# Patient Record
Sex: Male | Born: 1945 | Race: White | Hispanic: No | Marital: Married | Smoking: Never smoker
Health system: Southern US, Community
[De-identification: ages and names within clinical notes are randomized; demographics above are authoritative.]

## PROBLEM LIST (undated history)

## (undated) DIAGNOSIS — N390 Urinary tract infection, site not specified: Secondary | ICD-10-CM

## (undated) DIAGNOSIS — I10 Essential (primary) hypertension: Secondary | ICD-10-CM

## (undated) DIAGNOSIS — E119 Type 2 diabetes mellitus without complications: Secondary | ICD-10-CM

## (undated) HISTORY — DX: Type 2 diabetes mellitus without complications: E11.9

## (undated) HISTORY — PX: TOE AMPUTATION: SHX809

## (undated) HISTORY — DX: Essential (primary) hypertension: I10

## (undated) HISTORY — DX: Urinary tract infection, site not specified: N39.0

---

## 2019-08-25 ENCOUNTER — Emergency Department (HOSPITAL_BASED_OUTPATIENT_CLINIC_OR_DEPARTMENT_OTHER): Payer: Medicare Other

## 2019-08-25 ENCOUNTER — Encounter (HOSPITAL_BASED_OUTPATIENT_CLINIC_OR_DEPARTMENT_OTHER): Payer: Self-pay | Admitting: Emergency Medicine

## 2019-08-25 ENCOUNTER — Other Ambulatory Visit: Payer: Self-pay

## 2019-08-25 ENCOUNTER — Emergency Department (HOSPITAL_BASED_OUTPATIENT_CLINIC_OR_DEPARTMENT_OTHER)
Admission: EM | Admit: 2019-08-25 | Discharge: 2019-08-26 | Disposition: A | Discharge status: Home / Self Care | Payer: Medicare Other | Attending: Emergency Medicine | Admitting: Emergency Medicine

## 2019-08-25 DIAGNOSIS — Z7984 Long term (current) use of oral hypoglycemic drugs: Secondary | ICD-10-CM | POA: Diagnosis not present

## 2019-08-25 DIAGNOSIS — I10 Essential (primary) hypertension: Secondary | ICD-10-CM | POA: Insufficient documentation

## 2019-08-25 DIAGNOSIS — K8020 Calculus of gallbladder without cholecystitis without obstruction: Secondary | ICD-10-CM | POA: Diagnosis not present

## 2019-08-25 DIAGNOSIS — M545 Low back pain, unspecified: Secondary | ICD-10-CM

## 2019-08-25 DIAGNOSIS — E119 Type 2 diabetes mellitus without complications: Secondary | ICD-10-CM | POA: Diagnosis not present

## 2019-08-25 DIAGNOSIS — Z79899 Other long term (current) drug therapy: Secondary | ICD-10-CM | POA: Diagnosis not present

## 2019-08-25 MED ORDER — KETOROLAC TROMETHAMINE 60 MG/2ML IM SOLN
15.0000 mg | Freq: Once | INTRAMUSCULAR | Status: AC
  Administered 2019-08-25: 15 mg via INTRAMUSCULAR
  Filled 2019-08-25: qty 2

## 2019-08-25 MED ORDER — MORPHINE SULFATE 15 MG PO TABS: 7.5000 mg | ORAL_TABLET | ORAL | 0 refills | Status: DC | PRN

## 2019-08-25 MED ORDER — OXYCODONE HCL 5 MG PO TABS
5.0000 mg | ORAL_TABLET | Freq: Once | ORAL | Status: AC
  Administered 2019-08-25: 5 mg via ORAL
  Filled 2019-08-25: qty 1

## 2019-08-25 MED ORDER — MORPHINE SULFATE 15 MG PO TABS: 7.5000 mg | ORAL_TABLET | ORAL | 0 refills | Status: AC | PRN

## 2019-08-25 MED ORDER — DIAZEPAM 2 MG PO TABS
2.0000 mg | ORAL_TABLET | Freq: Once | ORAL | Status: AC
  Administered 2019-08-25: 2 mg via ORAL
  Filled 2019-08-25: qty 1

## 2019-08-25 MED ORDER — ACETAMINOPHEN 500 MG PO TABS
1000.0000 mg | ORAL_TABLET | Freq: Once | ORAL | Status: AC
  Administered 2019-08-25: 1000 mg via ORAL
  Filled 2019-08-25: qty 2

## 2019-08-25 NOTE — ED Triage Notes (Signed)
Patient presents via EMS with complaints of lower back pain onset this am; states he may have aggravated it while playing with his granddaughter; states also being treated for a UTI and having to urinate frequently; states today while in the recliner he fell from a standing position. States unable to stand or ambulate due to pain currently. Moving all extremities wnl.

## 2019-08-25 NOTE — ED Provider Notes (Signed)
[facility] EMERGENCY DEPARTMENT Provider Note   CSN: [id number] Arrival date & time: 08/25/19  2010     History Chief Complaint  Patient presents with  . Back Pain    Wyatt Garcia is a 74 y.o. male.  74 yo M with a chief complaints of low back pain.  This is been off and on for him but worse over the past 24 hours.  He ended up having a spasm that made it so he could not get up and walk and he ended up laying on the ground for a few hours.  Eventually he was able to make his way to the phone and called for help.  Denies any injury to his back.  Denies radiation of the pain.  Has been having some urinary symptoms and is currently on ciprofloxacin and feels that they are improving.  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  He denies any leg pain or weakness denies loss of bowel or bladder denies loss of perirectal sensation.  The history is provided by the patient.  Back Pain Location:  Lumbar spine Quality:  Aching, burning and cramping Radiates to:  Does not radiate Pain severity:  Severe Onset quality:  Gradual Duration:  1 day Timing:  Constant Progression:  Worsening Chronicity:  New Relieved by:  Being still Worsened by:  Palpation, touching, twisting and bending Ineffective treatments:  None tried Associated symptoms: no abdominal pain, no chest pain, no fever and no headaches        Past Medical History:  Diagnosis Date  . Diabetes mellitus without complication (HCC)   . Hypertension   . UTI (urinary tract infection)     There are no problems to display for this patient.   Past Surgical History:  Procedure Laterality Date  . TOE AMPUTATION         History reviewed. No pertinent family history.  Social History   Tobacco Use  . Smoking status: Never Smoker  . Smokeless tobacco: Never Used  Substance Use Topics  . Alcohol use: Not Currently  . Drug use: Not Currently    Home  Medications Prior to Admission medications   Medication Sig Start Date End Date Taking? Authorizing Provider  ENALAPRIL MALEATE PO Take by mouth.   Yes [provider]  Ibrutinib (IMBRUVICA PO) Take by mouth.   Yes [provider]  METFORMIN HCL PO Take by mouth.   Yes [provider]  morphine (MSIR) 15 MG tablet Take 0.5 tablets (7.5 mg total) by mouth every 4 (four) hours as needed for severe pain. 08/25/19   Melene Plan, DO    Allergies    Patient has no known allergies.  Review of Systems   Review of Systems  Constitutional: Negative for chills and fever.  HENT: Negative for congestion and facial swelling.   Eyes: Negative for discharge and visual disturbance.  Respiratory: Negative for shortness of breath.   Cardiovascular: Negative for chest pain and palpitations.  Gastrointestinal: Negative for abdominal pain, diarrhea and vomiting.  Musculoskeletal: Positive for back pain. Negative for arthralgias and myalgias.  Skin: Negative for color change and rash.  Neurological: Negative for tremors, syncope and headaches.  Psychiatric/Behavioral: Negative for confusion and dysphoric mood.    Physical Exam Updated Vital Signs BP 136/81 (BP Location: Right Arm)   Pulse 98   Temp 99.1 F (37.3 C) (Oral)   Resp 20   Ht 6\' 3"  (  1.905 m)   Wt 112 kg   SpO2 98%   BMI 30.87 kg/m   Physical Exam Vitals and nursing note reviewed.  Constitutional:      Appearance: He is well-developed.  HENT:     Head: Normocephalic and atraumatic.  Eyes:     Pupils: Pupils are equal, round, and reactive to light.  Neck:     Vascular: No JVD.  Cardiovascular:     Rate and Rhythm: Normal rate and regular rhythm.     Heart sounds: No murmur heard.  No friction rub. No gallop.   Pulmonary:     Effort: No respiratory distress.     Breath sounds: No wheezing.  Abdominal:     General: There is no distension.     Tenderness: There is no guarding or rebound.    Musculoskeletal:        General: Tenderness present. Normal range of motion.     Cervical back: Normal range of motion and neck supple.     Comments: Mild tenderness diffusely about the low back.  And some spasm.  Wrapped right lower extremity with prior amputation of the great toe.  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  Skin:    Coloration: Skin is not pale.     Findings: No rash.  Neurological:     Mental Status: He is alert and oriented to person, place, and time.  Psychiatric:        Behavior: Behavior normal.     ED Results / Procedures / Treatments   Labs (all labs ordered are listed, but only abnormal results are displayed) Labs Reviewed - No data to display  EKG None  Radiology CT L-SPINE NO CHARGE  Result Date: 08/25/2019 CLINICAL DATA:  Lower back pain. EXAM: CT LUMBAR SPINE WITHOUT CONTRAST TECHNIQUE: Multidetector CT imaging of the lumbar spine was performed without intravenous contrast administration. Multiplanar CT image reconstructions were also generated. COMPARISON:  None. FINDINGS: Segmentation: 5 lumbar type vertebrae. Alignment: Normal. Vertebrae: No acute fracture or focal pathologic process. Paraspinal and other soft tissues: Negative. Disc levels: Moderate to marked severity endplate sclerosis is seen at the level of L4-L5, with mild endplate sclerosis seen throughout the remainder of the lumbar spine. Marked severity intervertebral disc space narrowing is seen at the level of L4-L5. Mild intervertebral disc space narrowing is seen throughout the remainder of the lumbar spine. There is mild to moderate severity bilateral multilevel facet joint hypertrophy. IMPRESSION: 1. No acute osseous abnormality. 2. 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 Electronically Signed   By: Aram Candelahaddeus  Houston M.D.   On: 08/25/2019 21:22   CT Renal Stone Study  Result Date: 08/25/2019 CLINICAL DATA:  Lower back pain. EXAM: CT ABDOMEN AND  PELVIS WITHOUT CONTRAST TECHNIQUE: Multidetector CT imaging of the abdomen and pelvis was performed following the standard protocol without IV contrast. COMPARISON:  None. FINDINGS: Lower chest: Very mild atelectasis is seen within the posterior aspect of the bilateral lung bases. A small left pleural effusion is noted. Hepatobiliary: No focal liver abnormality is seen. A subcentimeter gallstone is seen within the gallbladder lumen without evidence of gallbladder wall thickening or biliary dilatation. Pancreas: Unremarkable. No pancreatic ductal dilatation or surrounding inflammatory changes. Spleen: Normal in size without focal abnormality. Adrenals/Urinary Tract: Adrenal glands are unremarkable. Kidneys are normal in size without focal lesions or hydronephrosis. A 9 mm nonobstructing renal stone is seen within the anterior aspect of the mid left kidney. Bladder is unremarkable.  Stomach/Bowel: There is a small to moderate size hiatal hernia with marked severity dilatation of the distal esophagus. Appendix appears normal. No evidence of bowel wall thickening, distention, or inflammatory changes. Vascular/Lymphatic: There is moderate severity calcification of the abdominal aorta. No enlarged abdominal or pelvic lymph nodes. Reproductive: The prostate gland is moderate to markedly enlarged. Other: There is a 2.8 cm x 1.4 cm fat containing right inguinal hernia. Musculoskeletal: Mild to moderate severity degenerative changes seen within the lumbar spine. This is most prominent at the level of L4-L5. IMPRESSION: 1. Small to moderate size hiatal hernia with marked severity dilatation of the distal esophagus. 2. Cholelithiasis without evidence of acute cholecystitis. 3. 9 mm nonobstructing renal stone within the left kidney. 4. Fat containing right inguinal hernia. 5. Moderate to markedly enlarged prostate gland. 6. Aortic atherosclerosis. Aortic Atherosclerosis (ICD10-I70.0). Electronically Signed   By: Aram Candela  M.D.   On: 08/25/2019 21:13    Procedures Procedures (including critical care time)  Medications Ordered in ED Medications  acetaminophen (TYLENOL) tablet 1,000 mg (1,000 mg Oral Given 08/25/19 2054)  ketorolac (TORADOL) injection 15 mg (15 mg Intramuscular Given 08/25/19 2056)  oxyCODONE (Oxy IR/ROXICODONE) immediate release tablet 5 mg (5 mg Oral Given 08/25/19 2054)  diazepam (VALIUM) tablet 2 mg (2 mg Oral Given 08/25/19 2054)    ED Course  I have reviewed the triage vital signs and the nursing notes.  Pertinent labs & imaging results that were available during my care of the patient were reviewed by me and considered in my medical decision making (see chart for details).    MDM Rules/Calculators/A&P                          74 yo M with a chief complaints of low back pain.  This is a recurrent issue for him but much worse recently than it has been.  No pain to the abdomen no fevers no loss of bowel or bladder no leg weakness or numbness.  Recently treated for urinary tract infection with ciprofloxacin.  Feels it is improving.  Otherwise denies urinary symptoms.  Will obtain a CT scan.  Treat his pain here.  Reassess.  CT scan without acute pathology in the abdomen or pelvis.  Patient does have some degenerative disease worse at L4-L5.  Discussed results with the patient.  He is feeling much better.  We will have him follow-up with his family doctor in the office.  10:43 PM:  I have discussed the diagnosis/risks/treatment options with the patient and family and believe the pt to be eligible for discharge home to follow-up with PCP. We also discussed returning to the ED immediately if new or worsening sx occur. We discussed the sx which are most concerning (e.g., sudden worsening pain, fever, inability to tolerate by mouth, cauda equina s/sx) that necessitate immediate return. Medications administered to the patient during their visit and any new prescriptions provided to the patient are  listed below.  Medications given during this visit Medications  acetaminophen (TYLENOL) tablet 1,000 mg (1,000 mg Oral Given 08/25/19 2054)  ketorolac (TORADOL) injection 15 mg (15 mg Intramuscular Given 08/25/19 2056)  oxyCODONE (Oxy IR/ROXICODONE) immediate release tablet 5 mg (5 mg Oral Given 08/25/19 2054)  diazepam (VALIUM) tablet 2 mg (2 mg Oral Given 08/25/19 2054)     The patient appears reasonably screen and/or stabilized for discharge and I doubt any other medical condition or other [place] requiring further screening, evaluation, or  treatment in the ED at this time prior to discharge.   Final Clinical Impression(s) / ED Diagnoses Final diagnoses:  Low back pain    Rx / DC Orders ED Discharge Orders         Ordered    morphine (MSIR) 15 MG tablet  Every 4 hours PRN,   Status:  Discontinued     Reprint     08/25/19 2211    morphine (MSIR) 15 MG tablet  Every 4 hours PRN     Discontinue  Reprint     08/25/19 2212           Melene Plan, DO 08/25/19 2243

## 2019-08-25 NOTE — Discharge Instructions (Signed)

## 2021-12-30 IMAGING — CT CT RENAL STONE PROTOCOL
3 of 7 series · 16 of 46 positions shown, 18 images · non-contrast
Comparison: None.

CLINICAL DATA: Lower back pain.

EXAM:
CT ABDOMEN AND PELVIS WITHOUT CONTRAST
TECHNIQUE: Multidetector CT imaging of the abdomen and pelvis was performed
following the standard protocol without IV contrast.

[Series 2: axial st · axial · 0.80mm/px · z∈[+614,+1034]mm · 11 of 102 slices shown, 13 images]
[im 9/102  soft-tissue]
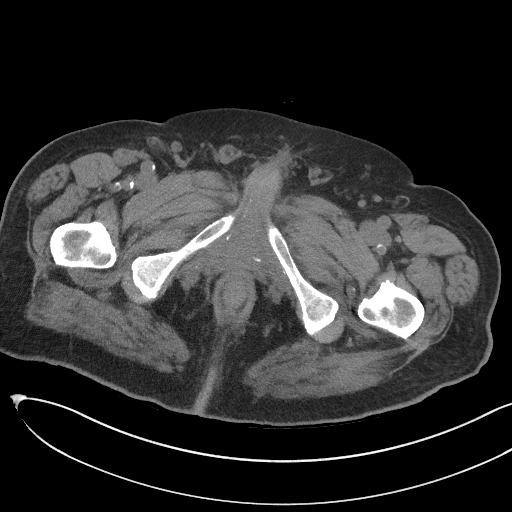
[im 9/102  bone]
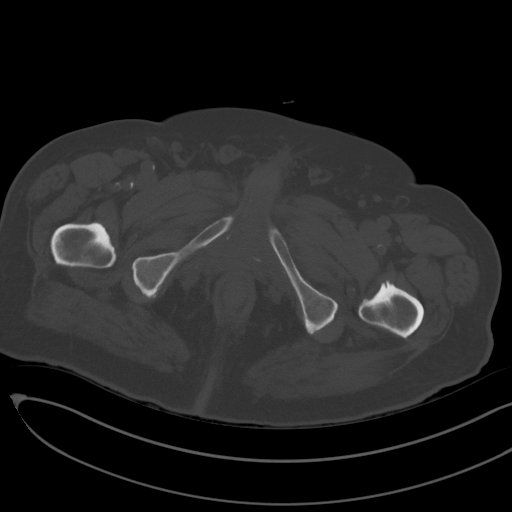
[im 17/102  soft-tissue]
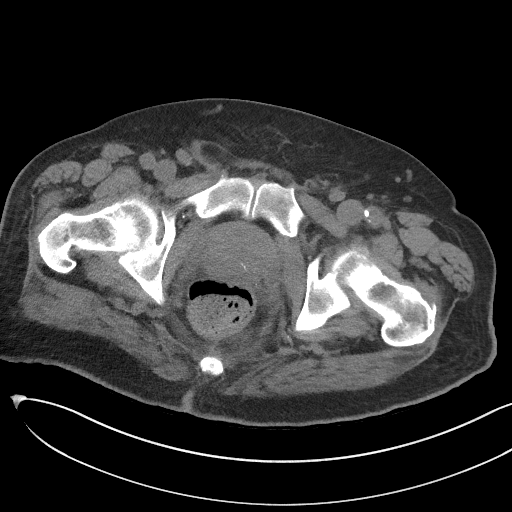
[im 26/102  soft-tissue]
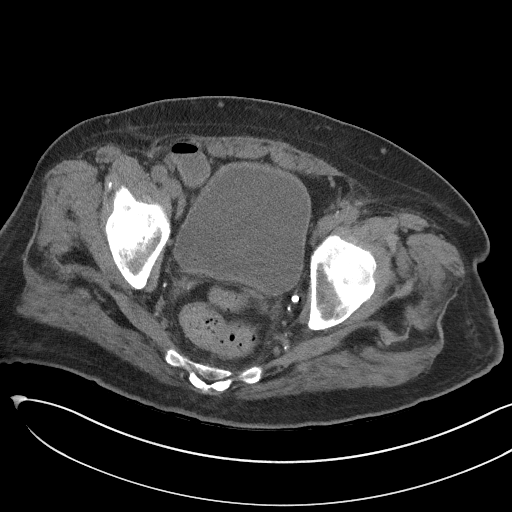
[im 34/102  soft-tissue]
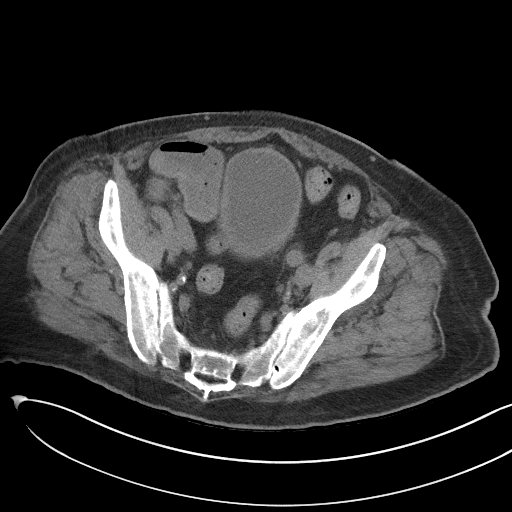
[im 43/102  soft-tissue]
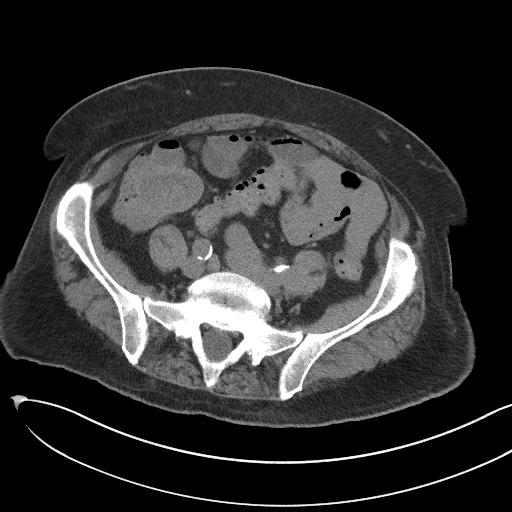
[im 51/102  soft-tissue]
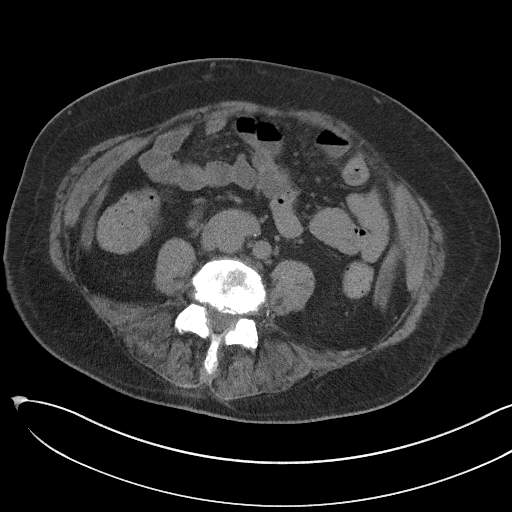
[im 59/102  soft-tissue]
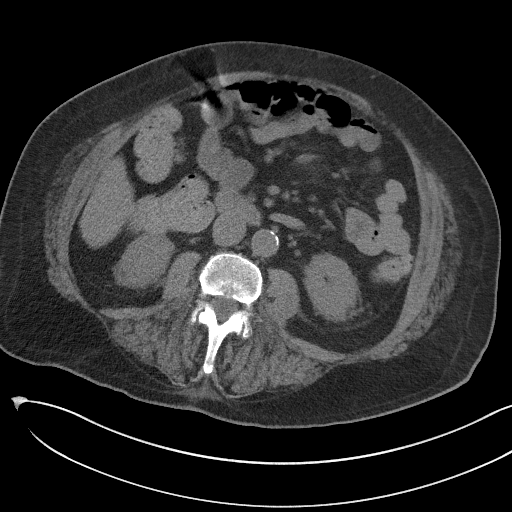
[im 68/102  soft-tissue]
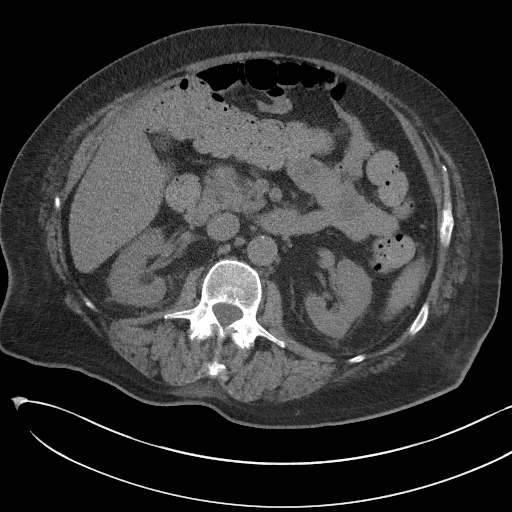
[im 76/102  soft-tissue]
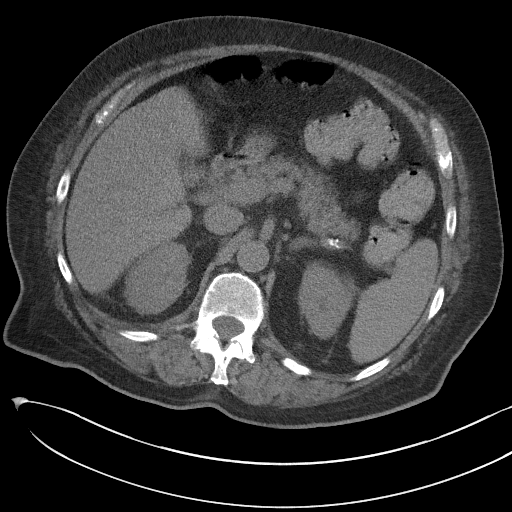
[im 76/102  bone]
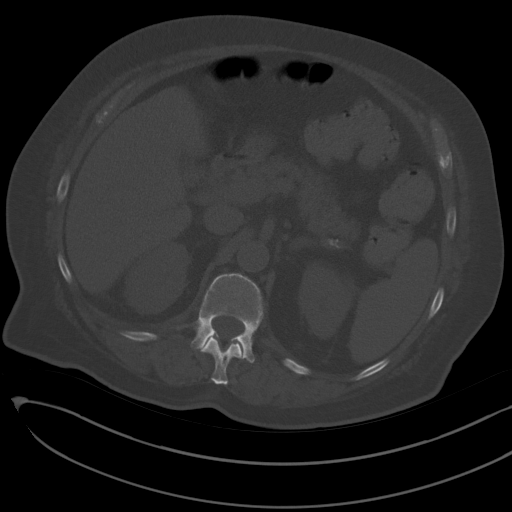
[im 85/102  soft-tissue]
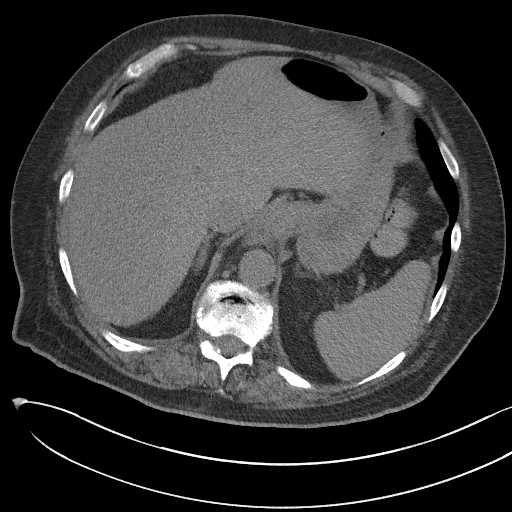
[im 93/102  soft-tissue]
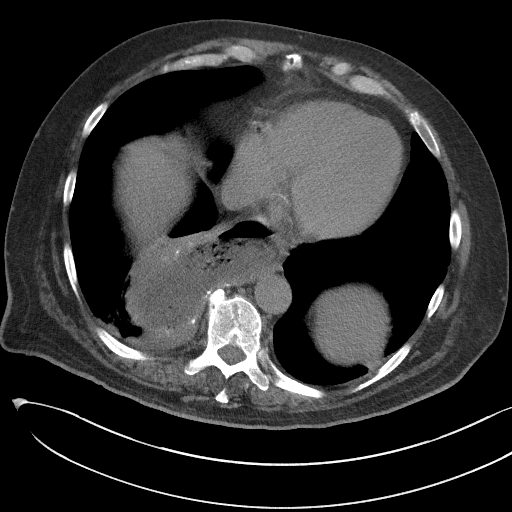

[Series 5: coronal st · coronal · 0.85mm/px · 3 of 113 slices shown]
[im 23/113  soft-tissue]
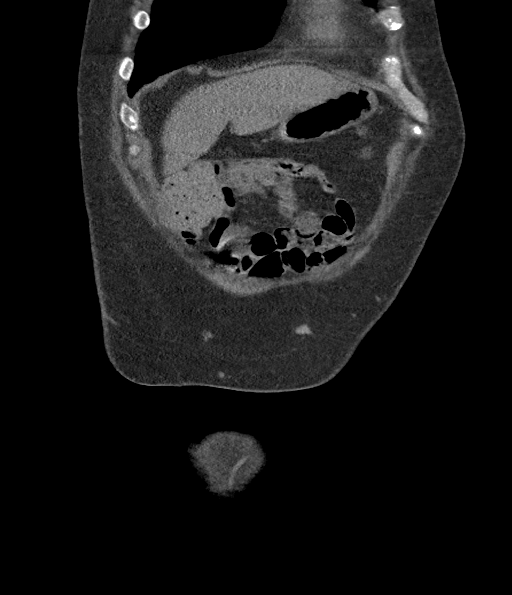
[im 45/113  soft-tissue]
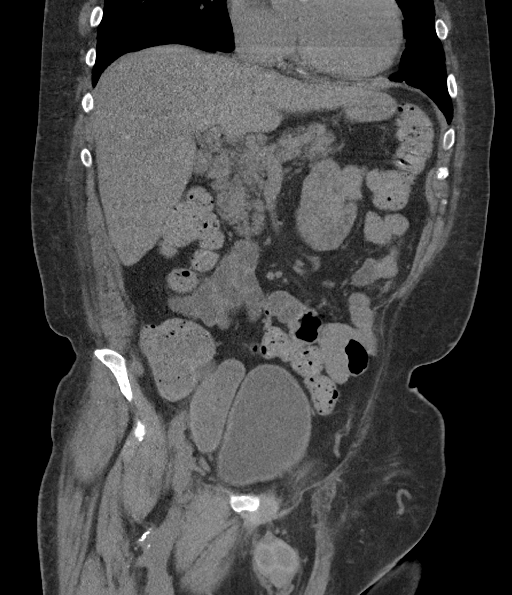
[im 68/113  soft-tissue]
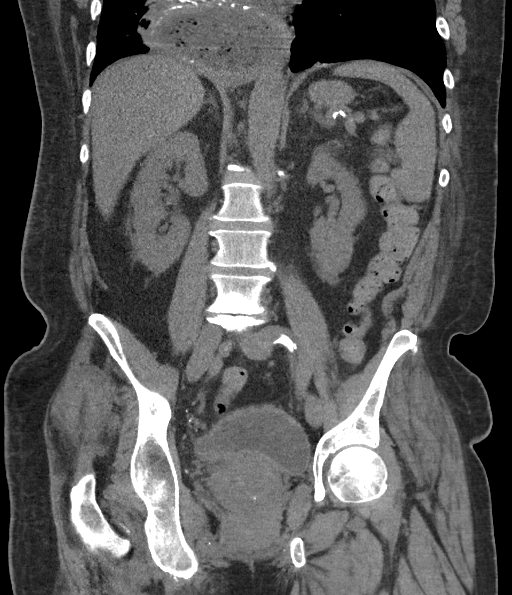

[Series 7: bone · axial · 0.38mm/px · z∈[+732,+768]mm · 2 of 147 slices shown]
[im 10/147  bone]
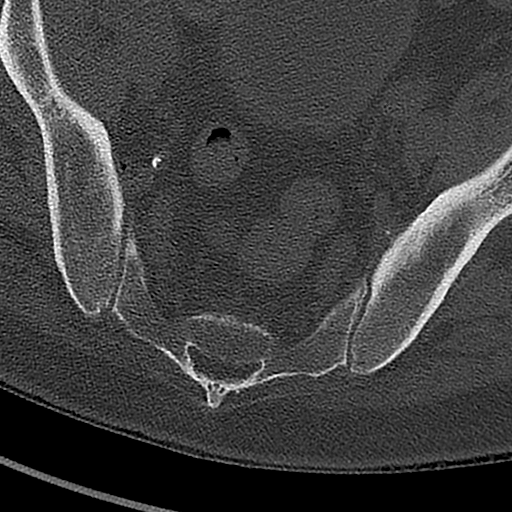
[im 28/147  bone]
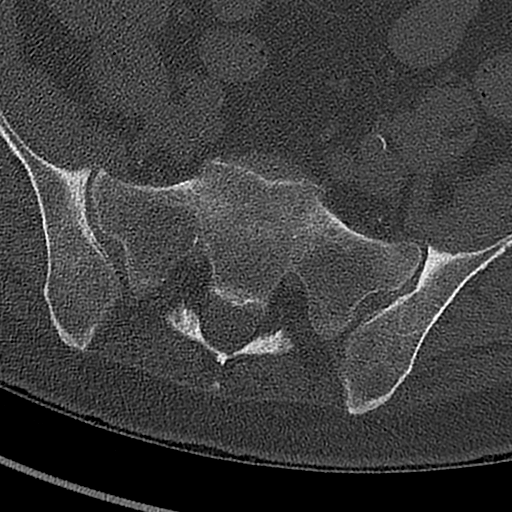

[16 of 46 positions shown; findings below may reference images not displayed]

FINDINGS: Lower chest: Very mild atelectasis is seen within the posterior
aspect of the bilateral lung bases.

A small left pleural effusion is noted.

Hepatobiliary: No focal liver abnormality is seen. A subcentimeter
gallstone is seen within the gallbladder lumen without evidence of
gallbladder wall thickening or biliary dilatation.

Pancreas: Unremarkable. No pancreatic ductal dilatation or
surrounding inflammatory changes.

Spleen: Normal in size without focal abnormality.

Adrenals/Urinary Tract: Adrenal glands are unremarkable. Kidneys are
normal in size without focal lesions or hydronephrosis. A 9 mm
nonobstructing renal stone is seen within the anterior aspect of the
mid left kidney. Bladder is unremarkable.

Stomach/Bowel: There is a small to moderate size hiatal hernia with
marked severity dilatation of the distal esophagus. Appendix appears
normal. No evidence of bowel wall thickening, distention, or
inflammatory changes.

Vascular/Lymphatic: There is moderate severity calcification of the
abdominal aorta. No enlarged abdominal or pelvic lymph nodes.

Reproductive: The prostate gland is moderate to markedly enlarged.

Other: There is a 2.8 cm x 1.4 cm fat containing right inguinal
hernia.

Musculoskeletal: Mild to moderate severity degenerative changes seen
within the lumbar spine. This is most prominent at the level of
L4-L5.
IMPRESSION: 1. Small to moderate size hiatal hernia with marked severity
dilatation of the distal esophagus.
2. Cholelithiasis without evidence of acute cholecystitis.
3. 9 mm nonobstructing renal stone within the left kidney.
4. Fat containing right inguinal hernia.
5. Moderate to markedly enlarged prostate gland.
6. Aortic atherosclerosis.

Aortic Atherosclerosis ([id number]).
